# Patient Record
Sex: Female | Born: 1995 | Race: White | Hispanic: No | Marital: Single | Smoking: Never smoker
Health system: Southern US, Community
[De-identification: ages and names within clinical notes are randomized; demographics above are authoritative.]

## PROBLEM LIST (undated history)

## (undated) DIAGNOSIS — R197 Diarrhea, unspecified: Secondary | ICD-10-CM

## (undated) DIAGNOSIS — J302 Other seasonal allergic rhinitis: Secondary | ICD-10-CM

## (undated) HISTORY — PX: TOE SURGERY: SHX1073

## (undated) HISTORY — PX: TONSILLECTOMY: SUR1361

## (undated) HISTORY — DX: Diarrhea, unspecified: R19.7

## (undated) HISTORY — PX: WISDOM TOOTH EXTRACTION: SHX21

---
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
## 2018-10-15 ENCOUNTER — Other Ambulatory Visit: Payer: Self-pay

## 2018-10-15 ENCOUNTER — Emergency Department (HOSPITAL_COMMUNITY): Payer: Medicaid Other

## 2018-10-15 ENCOUNTER — Encounter (HOSPITAL_COMMUNITY): Payer: Self-pay

## 2018-10-15 DIAGNOSIS — W228XXA Striking against or struck by other objects, initial encounter: Secondary | ICD-10-CM | POA: Diagnosis not present

## 2018-10-15 DIAGNOSIS — R030 Elevated blood-pressure reading, without diagnosis of hypertension: Secondary | ICD-10-CM | POA: Diagnosis not present

## 2018-10-15 DIAGNOSIS — S90121A Contusion of right lesser toe(s) without damage to nail, initial encounter: Secondary | ICD-10-CM | POA: Diagnosis not present

## 2018-10-15 DIAGNOSIS — S99921A Unspecified injury of right foot, initial encounter: Secondary | ICD-10-CM | POA: Diagnosis present

## 2018-10-15 DIAGNOSIS — Y998 Other external cause status: Secondary | ICD-10-CM | POA: Diagnosis not present

## 2018-10-15 DIAGNOSIS — Y9389 Activity, other specified: Secondary | ICD-10-CM | POA: Diagnosis not present

## 2018-10-15 DIAGNOSIS — Y929 Unspecified place or not applicable: Secondary | ICD-10-CM | POA: Diagnosis not present

## 2018-10-15 NOTE — ED Triage Notes (Signed)
Pt reports that she broke her R pinky toe last night. States that last night, it was numb, but tonight it hurts a lot more.

## 2018-10-16 ENCOUNTER — Emergency Department (HOSPITAL_COMMUNITY)
Admission: EM | Admit: 2018-10-16 | Discharge: 2018-10-16 | Disposition: A | Discharge status: Home / Self Care | Payer: Medicaid Other | Attending: Emergency Medicine | Admitting: Emergency Medicine

## 2018-10-16 DIAGNOSIS — R030 Elevated blood-pressure reading, without diagnosis of hypertension: Secondary | ICD-10-CM

## 2018-10-16 DIAGNOSIS — S90121A Contusion of right lesser toe(s) without damage to nail, initial encounter: Secondary | ICD-10-CM

## 2018-10-16 NOTE — Discharge Instructions (Signed)
Apply ice for 30 minutes at a time, 4 times a day.  Take naproxen or ibuprofen for pain.  For additional pain relief, add acetaminophen.  Your blood pressure was very high today.  That might be because of the stress of coming to the hospital, but you should have your blood pressure checked several times in the next 2 weeks to make sure that it is coming down.  If it is staying high, you may need to be started on medication to control your blood pressure.

## 2018-10-16 NOTE — ED Provider Notes (Signed)
[facility] DEPT Provider Note   CSN: [id number] Arrival date & time: 10/15/18  2212    History   Chief Complaint Chief Complaint  Patient presents with  . Toe Pain    HPI Misty Griffith is a 23 y.o. female.   The history is provided by the patient.  Toe Pain  She states that she injured her right fifth toe yesterday when she accidentally hit it on a sack of potatoes, and she thinks she broke her toe.  She denies other injury.  Pain is rated at 9/10.  She has treated it with keeping it elevated, but has not taken anything for pain and has not applied ice.  Past Medical History:  Diagnosis Date  . Diarrhea    Possible IBS?  . Seasonal allergies     Patient Active Problem List   Diagnosis Date Noted  . Excessive gas 06/28/2011  . Simple constipation 06/20/2011  . Lower abdominal pain     Past Surgical History:  Procedure Laterality Date  . TOE SURGERY    . TONSILLECTOMY    . WISDOM TOOTH EXTRACTION       OB History    Gravida  0   Para      Term      Preterm      AB      Living        SAB      TAB      Ectopic      Multiple      Live Births               Home Medications    Prior to Admission medications   Medication Sig Start Date End Date Taking? Authorizing Provider  cetirizine (ZYRTEC) 10 MG tablet Take 10 mg by mouth daily as needed for allergies.     [provider]  HYDROcodone-acetaminophen (NORCO/VICODIN) 5-325 MG per tablet Take 0.5 tablets by mouth at bedtime as needed (cough). 09/17/13   Gregor Hams, MD  naproxen sodium (ANAPROX) 220 MG tablet Take 220 mg by mouth 2 (two) times daily as needed.    [provider]  predniSONE (DELTASONE) 10 MG tablet Take 3 tablets (30 mg total) by mouth daily. 09/17/13   Gregor Hams, MD    Family History History reviewed. No pertinent family history.  Social History Social History   Tobacco Use  . Smoking status: Never Smoker  .  Smokeless tobacco: Never Used  Substance Use Topics  . Alcohol use: No  . Drug use: No     Allergies   Food   Review of Systems Review of Systems  All other systems reviewed and are negative.    Physical Exam Updated Vital Signs BP (!) 153/111   Pulse (!) 130   Temp 98.7 F (37.1 C) (Oral)   Resp 16   Ht 5\' 4"  (1.626 m)   LMP 10/06/2018   SpO2 98%   Physical Exam Vitals signs and nursing note reviewed.    23 year old female, resting comfortably and in no acute distress. Vital signs are significant for elevated blood pressure and heart rate. Oxygen saturation is 98%, which is normal. Head is normocephalic and atraumatic. PERRLA, EOMI. Oropharynx is clear. Neck is nontender and supple without adenopathy or JVD. Back is nontender and there is no CVA tenderness. Lungs are clear without rales, wheezes, or rhonchi. Chest is nontender. Heart has regular rate and rhythm without murmur. Abdomen is soft,  flat, nontender without masses or hepatosplenomegaly and peristalsis is normoactive. Extremities: Minimal swelling and ecchymosis distal phalanx of right fifth toe.  There is tenderness palpation over the middle and distal phalanges of the right fifth toe.  No other injury seen. Skin is warm and dry without rash. Neurologic: Mental status is normal, cranial nerves are intact, there are no motor or sensory deficits.  ED Treatments / Results   Radiology Dg Foot Complete Right  Result Date: 10/15/2018 CLINICAL DATA:  23 year old female with injury to the right pinky toe. EXAM: RIGHT FOOT COMPLETE - 3+ VIEW COMPARISON:  Right foot radiograph dated 06/27/2012 FINDINGS: There is no evidence of fracture or dislocation. There is no evidence of arthropathy or other focal bone abnormality. Soft tissues are unremarkable. IMPRESSION: Negative. Electronically Signed   By: Elgie CollardArash  Radparvar M.D.   On: 10/15/2018 23:30    Procedures Procedures  Medications Ordered in ED Medications - No  data to display   Initial Impression / Assessment and Plan / ED Course  I have reviewed the triage vital signs and the nursing notes.  Pertinent imaging results that were available during my care of the patient were reviewed by me and considered in my medical decision making (see chart for details).  Injury to right fifth toe.  X-rays obtained showing no evidence of fracture.  Patient is reassured about x-ray findings, encouraged to apply ice and keep foot elevated.  Also advised of elevated blood pressure and need for follow-up as an outpatient.  Old records are reviewed, and she has no relevant past visits  Final Clinical Impressions(s) / ED Diagnoses   Final diagnoses:  Contusion of fifth toe, right, initial encounter  Elevated blood pressure reading without diagnosis of hypertension    ED Discharge Orders    None       Dione BoozeGlick, Tomika Eckles, MD 10/16/18 0127

## 2021-05-20 IMAGING — CR RIGHT FOOT COMPLETE - 3+ VIEW
3 series · 3 of 3 positions shown · non-contrast
Comparison: Right foot radiograph dated 06/27/2012

CLINICAL DATA: 22-year-old female with injury to the right pinky
toe.

EXAM:
RIGHT FOOT COMPLETE - 3+ VIEW

[x foot ap right]
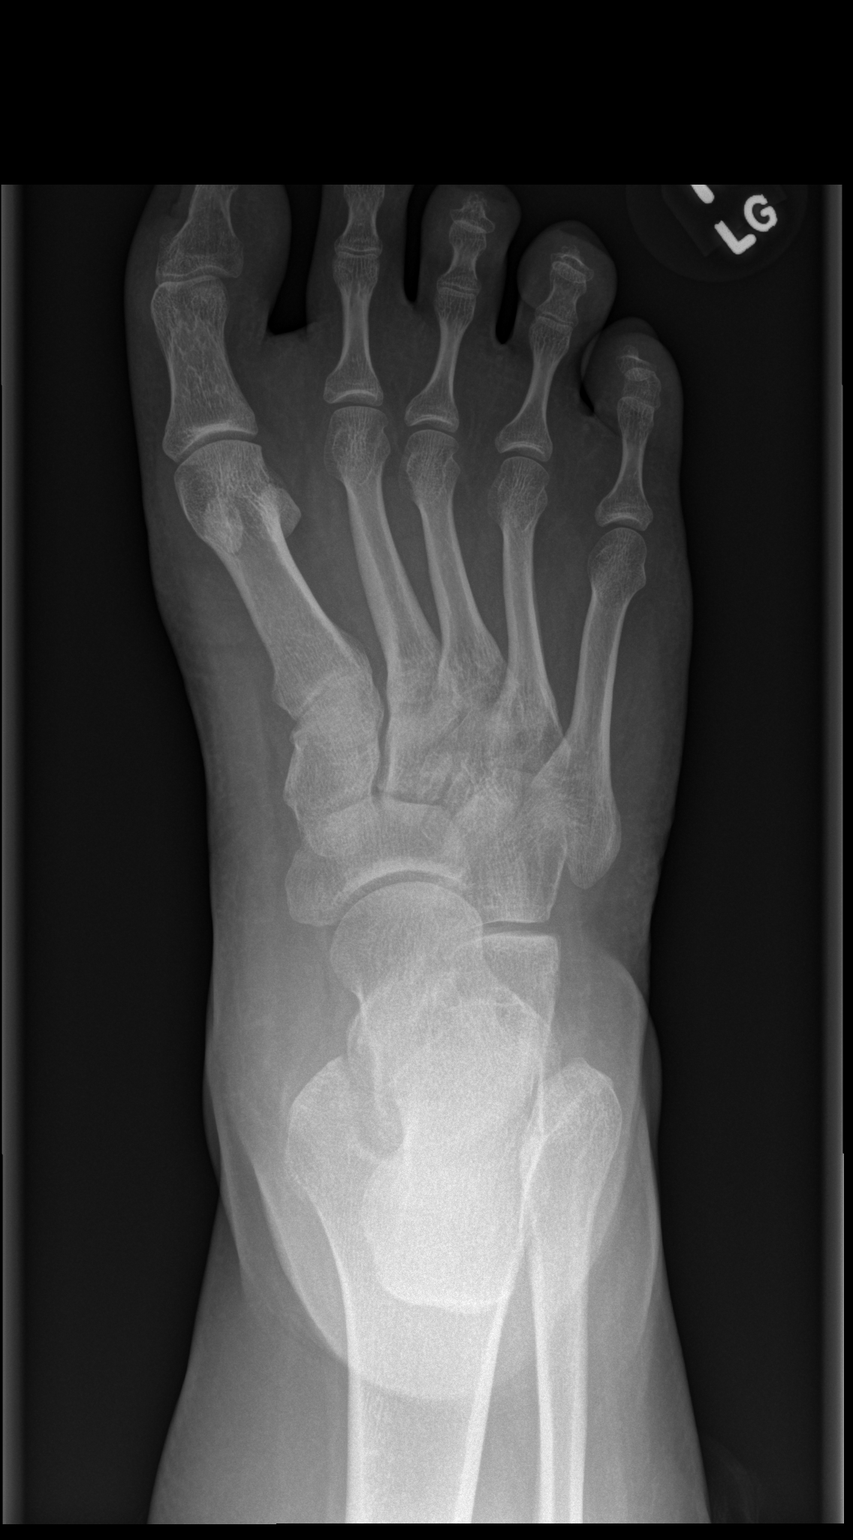

[x foot obl right]
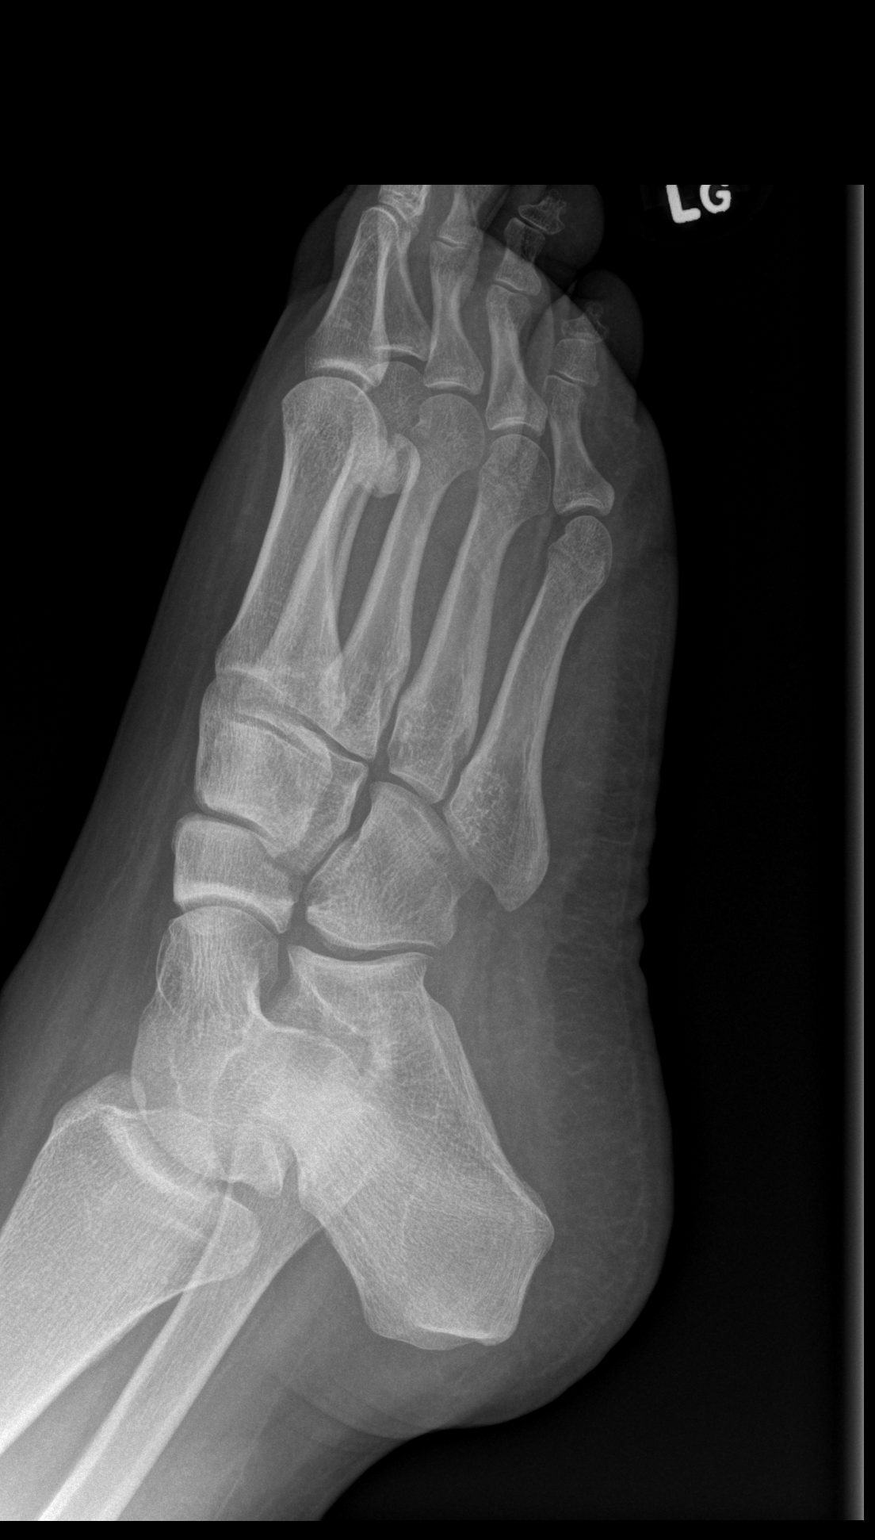

[x foot lat right]
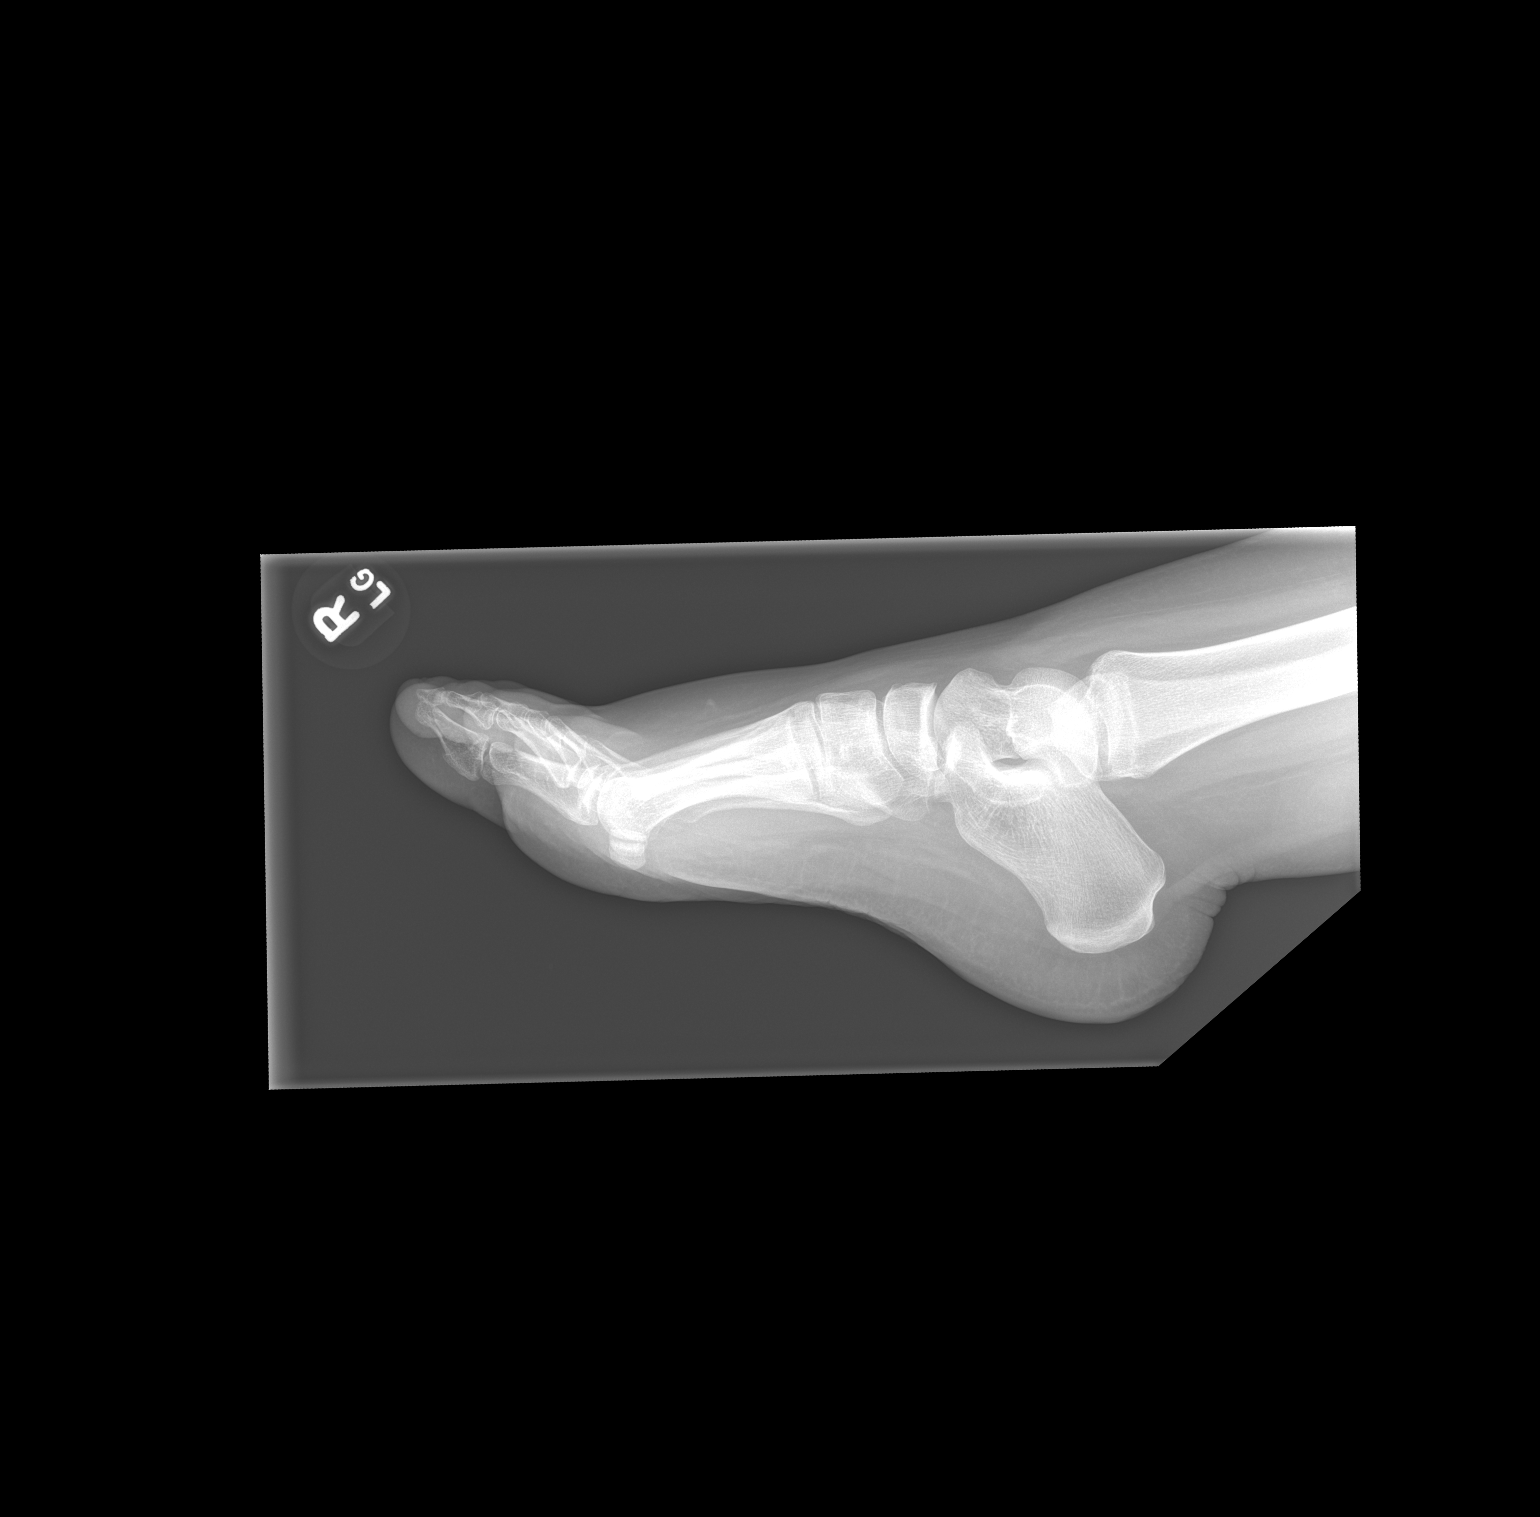

[3 of 3 positions shown; findings below may reference images not displayed]

FINDINGS: There is no evidence of fracture or dislocation. There is no
evidence of arthropathy or other focal bone abnormality. Soft
tissues are unremarkable.
IMPRESSION: Negative.

## 2021-10-16 ENCOUNTER — Encounter: Payer: Self-pay | Admitting: General Practice

## 2021-10-17 ENCOUNTER — Telehealth: Payer: Self-pay | Admitting: General Practice

## 2021-10-17 NOTE — Telephone Encounter (Signed)
Patient referred to our office.  Left message on VM for pt to contact our office to schedule new patient appt.

## 2022-07-26 ENCOUNTER — Other Ambulatory Visit (HOSPITAL_BASED_OUTPATIENT_CLINIC_OR_DEPARTMENT_OTHER): Payer: Self-pay

## 2022-07-26 MED ORDER — MOUNJARO 5 MG/0.5ML ~~LOC~~ SOAJ
5.0000 mg | SUBCUTANEOUS | 0 refills | Status: DC
  Filled 2022-07-26 (×2): qty 2, 28d supply, fill #0

## 2022-07-27 ENCOUNTER — Other Ambulatory Visit (HOSPITAL_BASED_OUTPATIENT_CLINIC_OR_DEPARTMENT_OTHER): Payer: Self-pay

## 2022-07-31 ENCOUNTER — Other Ambulatory Visit (HOSPITAL_BASED_OUTPATIENT_CLINIC_OR_DEPARTMENT_OTHER): Payer: Self-pay

## 2022-08-01 ENCOUNTER — Other Ambulatory Visit (HOSPITAL_BASED_OUTPATIENT_CLINIC_OR_DEPARTMENT_OTHER): Payer: Self-pay

## 2022-08-02 ENCOUNTER — Other Ambulatory Visit (HOSPITAL_BASED_OUTPATIENT_CLINIC_OR_DEPARTMENT_OTHER): Payer: Self-pay

## 2022-08-22 ENCOUNTER — Other Ambulatory Visit (HOSPITAL_BASED_OUTPATIENT_CLINIC_OR_DEPARTMENT_OTHER): Payer: Self-pay

## 2022-08-22 MED ORDER — MOUNJARO 5 MG/0.5ML ~~LOC~~ SOAJ
5.0000 mg | SUBCUTANEOUS | 5 refills | Status: AC
  Filled 2022-08-22 – 2022-08-27 (×3): qty 2, 28d supply, fill #0

## 2022-08-27 ENCOUNTER — Other Ambulatory Visit (HOSPITAL_BASED_OUTPATIENT_CLINIC_OR_DEPARTMENT_OTHER): Payer: Self-pay

## 2022-08-28 ENCOUNTER — Other Ambulatory Visit (HOSPITAL_BASED_OUTPATIENT_CLINIC_OR_DEPARTMENT_OTHER): Payer: Self-pay

## 2022-08-29 ENCOUNTER — Other Ambulatory Visit (HOSPITAL_BASED_OUTPATIENT_CLINIC_OR_DEPARTMENT_OTHER): Payer: Self-pay

## 2022-08-30 ENCOUNTER — Other Ambulatory Visit (HOSPITAL_BASED_OUTPATIENT_CLINIC_OR_DEPARTMENT_OTHER): Payer: Self-pay

## 2022-09-03 ENCOUNTER — Other Ambulatory Visit (HOSPITAL_BASED_OUTPATIENT_CLINIC_OR_DEPARTMENT_OTHER): Payer: Self-pay

## 2022-09-04 ENCOUNTER — Other Ambulatory Visit (HOSPITAL_BASED_OUTPATIENT_CLINIC_OR_DEPARTMENT_OTHER): Payer: Self-pay

## 2022-09-05 ENCOUNTER — Other Ambulatory Visit (HOSPITAL_BASED_OUTPATIENT_CLINIC_OR_DEPARTMENT_OTHER): Payer: Self-pay

## 2022-09-06 ENCOUNTER — Other Ambulatory Visit (HOSPITAL_BASED_OUTPATIENT_CLINIC_OR_DEPARTMENT_OTHER): Payer: Self-pay

## 2022-09-07 ENCOUNTER — Other Ambulatory Visit (HOSPITAL_BASED_OUTPATIENT_CLINIC_OR_DEPARTMENT_OTHER): Payer: Self-pay

## 2022-09-10 ENCOUNTER — Other Ambulatory Visit (HOSPITAL_BASED_OUTPATIENT_CLINIC_OR_DEPARTMENT_OTHER): Payer: Self-pay

## 2022-09-11 ENCOUNTER — Other Ambulatory Visit (HOSPITAL_BASED_OUTPATIENT_CLINIC_OR_DEPARTMENT_OTHER): Payer: Self-pay

## 2022-09-12 ENCOUNTER — Other Ambulatory Visit (HOSPITAL_BASED_OUTPATIENT_CLINIC_OR_DEPARTMENT_OTHER): Payer: Self-pay

## 2022-09-24 ENCOUNTER — Other Ambulatory Visit (HOSPITAL_BASED_OUTPATIENT_CLINIC_OR_DEPARTMENT_OTHER): Payer: Self-pay

## 2022-10-04 ENCOUNTER — Other Ambulatory Visit (HOSPITAL_BASED_OUTPATIENT_CLINIC_OR_DEPARTMENT_OTHER): Payer: Self-pay

## 2022-10-05 ENCOUNTER — Other Ambulatory Visit (HOSPITAL_BASED_OUTPATIENT_CLINIC_OR_DEPARTMENT_OTHER): Payer: Self-pay

## 2022-10-08 ENCOUNTER — Other Ambulatory Visit (HOSPITAL_BASED_OUTPATIENT_CLINIC_OR_DEPARTMENT_OTHER): Payer: Self-pay

## 2022-10-09 ENCOUNTER — Other Ambulatory Visit (HOSPITAL_BASED_OUTPATIENT_CLINIC_OR_DEPARTMENT_OTHER): Payer: Self-pay

## 2022-10-11 ENCOUNTER — Other Ambulatory Visit (HOSPITAL_BASED_OUTPATIENT_CLINIC_OR_DEPARTMENT_OTHER): Payer: Self-pay

## 2022-10-15 ENCOUNTER — Other Ambulatory Visit (HOSPITAL_BASED_OUTPATIENT_CLINIC_OR_DEPARTMENT_OTHER): Payer: Self-pay

## 2022-10-16 ENCOUNTER — Other Ambulatory Visit (HOSPITAL_BASED_OUTPATIENT_CLINIC_OR_DEPARTMENT_OTHER): Payer: Self-pay

## 2022-10-19 ENCOUNTER — Other Ambulatory Visit (HOSPITAL_BASED_OUTPATIENT_CLINIC_OR_DEPARTMENT_OTHER): Payer: Self-pay

## 2022-10-22 ENCOUNTER — Other Ambulatory Visit (HOSPITAL_BASED_OUTPATIENT_CLINIC_OR_DEPARTMENT_OTHER): Payer: Self-pay

## 2022-10-23 ENCOUNTER — Other Ambulatory Visit (HOSPITAL_BASED_OUTPATIENT_CLINIC_OR_DEPARTMENT_OTHER): Payer: Self-pay

## 2022-11-05 ENCOUNTER — Other Ambulatory Visit (HOSPITAL_BASED_OUTPATIENT_CLINIC_OR_DEPARTMENT_OTHER): Payer: Self-pay

## 2022-11-06 ENCOUNTER — Other Ambulatory Visit (HOSPITAL_BASED_OUTPATIENT_CLINIC_OR_DEPARTMENT_OTHER): Payer: Self-pay

## 2022-11-08 ENCOUNTER — Other Ambulatory Visit (HOSPITAL_BASED_OUTPATIENT_CLINIC_OR_DEPARTMENT_OTHER): Payer: Self-pay

## 2022-11-12 ENCOUNTER — Other Ambulatory Visit (HOSPITAL_BASED_OUTPATIENT_CLINIC_OR_DEPARTMENT_OTHER): Payer: Self-pay

## 2022-11-12 MED ORDER — ONDANSETRON 4 MG PO TBDP
4.0000 mg | ORAL_TABLET | Freq: Three times a day (TID) | ORAL | 0 refills | Status: AC | PRN
  Filled 2022-11-12: qty 45, 15d supply, fill #0

## 2022-11-12 MED ORDER — METFORMIN HCL ER 500 MG PO TB24
ORAL_TABLET | ORAL | 2 refills | Status: AC
  Filled 2022-11-12: qty 60, 33d supply, fill #0

## 2022-11-12 MED ORDER — FLUTICASONE PROPIONATE 50 MCG/ACT NA SUSP
2.0000 | Freq: Every day | NASAL | 1 refills | Status: AC
  Filled 2022-11-12: qty 16, 30d supply, fill #0

## 2022-11-14 ENCOUNTER — Other Ambulatory Visit (HOSPITAL_BASED_OUTPATIENT_CLINIC_OR_DEPARTMENT_OTHER): Payer: Self-pay

## 2022-11-15 ENCOUNTER — Other Ambulatory Visit (HOSPITAL_BASED_OUTPATIENT_CLINIC_OR_DEPARTMENT_OTHER): Payer: Self-pay

## 2022-12-11 ENCOUNTER — Other Ambulatory Visit (HOSPITAL_BASED_OUTPATIENT_CLINIC_OR_DEPARTMENT_OTHER): Payer: Self-pay

## 2022-12-11 MED ORDER — MOUNJARO 2.5 MG/0.5ML ~~LOC~~ SOAJ
2.5000 mg | SUBCUTANEOUS | 1 refills | Status: AC
  Filled 2022-12-11: qty 2, 28d supply, fill #0

## 2022-12-13 ENCOUNTER — Other Ambulatory Visit (HOSPITAL_BASED_OUTPATIENT_CLINIC_OR_DEPARTMENT_OTHER): Payer: Self-pay

## 2022-12-14 ENCOUNTER — Other Ambulatory Visit (HOSPITAL_BASED_OUTPATIENT_CLINIC_OR_DEPARTMENT_OTHER): Payer: Self-pay

## 2022-12-24 ENCOUNTER — Other Ambulatory Visit (HOSPITAL_BASED_OUTPATIENT_CLINIC_OR_DEPARTMENT_OTHER): Payer: Self-pay

## 2022-12-25 ENCOUNTER — Other Ambulatory Visit (HOSPITAL_BASED_OUTPATIENT_CLINIC_OR_DEPARTMENT_OTHER): Payer: Self-pay

## 2022-12-28 ENCOUNTER — Encounter (HOSPITAL_BASED_OUTPATIENT_CLINIC_OR_DEPARTMENT_OTHER): Payer: Self-pay

## 2022-12-28 ENCOUNTER — Other Ambulatory Visit (HOSPITAL_BASED_OUTPATIENT_CLINIC_OR_DEPARTMENT_OTHER): Payer: Self-pay

## 2022-12-31 ENCOUNTER — Other Ambulatory Visit (HOSPITAL_BASED_OUTPATIENT_CLINIC_OR_DEPARTMENT_OTHER): Payer: Self-pay

## 2023-01-01 ENCOUNTER — Other Ambulatory Visit (HOSPITAL_BASED_OUTPATIENT_CLINIC_OR_DEPARTMENT_OTHER): Payer: Self-pay

## 2023-01-03 ENCOUNTER — Other Ambulatory Visit (HOSPITAL_BASED_OUTPATIENT_CLINIC_OR_DEPARTMENT_OTHER): Payer: Self-pay

## 2023-01-04 ENCOUNTER — Other Ambulatory Visit (HOSPITAL_BASED_OUTPATIENT_CLINIC_OR_DEPARTMENT_OTHER): Payer: Self-pay

## 2023-01-09 ENCOUNTER — Other Ambulatory Visit (HOSPITAL_BASED_OUTPATIENT_CLINIC_OR_DEPARTMENT_OTHER): Payer: Self-pay

## 2023-01-11 ENCOUNTER — Other Ambulatory Visit (HOSPITAL_BASED_OUTPATIENT_CLINIC_OR_DEPARTMENT_OTHER): Payer: Self-pay

## 2023-01-14 ENCOUNTER — Other Ambulatory Visit (HOSPITAL_BASED_OUTPATIENT_CLINIC_OR_DEPARTMENT_OTHER): Payer: Self-pay

## 2023-01-16 ENCOUNTER — Other Ambulatory Visit (HOSPITAL_BASED_OUTPATIENT_CLINIC_OR_DEPARTMENT_OTHER): Payer: Self-pay

## 2023-01-17 ENCOUNTER — Other Ambulatory Visit (HOSPITAL_BASED_OUTPATIENT_CLINIC_OR_DEPARTMENT_OTHER): Payer: Self-pay

## 2023-01-18 ENCOUNTER — Other Ambulatory Visit (HOSPITAL_BASED_OUTPATIENT_CLINIC_OR_DEPARTMENT_OTHER): Payer: Self-pay

## 2023-01-21 ENCOUNTER — Other Ambulatory Visit (HOSPITAL_BASED_OUTPATIENT_CLINIC_OR_DEPARTMENT_OTHER): Payer: Self-pay

## 2023-01-22 ENCOUNTER — Other Ambulatory Visit (HOSPITAL_BASED_OUTPATIENT_CLINIC_OR_DEPARTMENT_OTHER): Payer: Self-pay

## 2023-01-24 ENCOUNTER — Other Ambulatory Visit (HOSPITAL_BASED_OUTPATIENT_CLINIC_OR_DEPARTMENT_OTHER): Payer: Self-pay

## 2023-01-29 ENCOUNTER — Other Ambulatory Visit (HOSPITAL_BASED_OUTPATIENT_CLINIC_OR_DEPARTMENT_OTHER): Payer: Self-pay

## 2023-02-04 ENCOUNTER — Other Ambulatory Visit (HOSPITAL_BASED_OUTPATIENT_CLINIC_OR_DEPARTMENT_OTHER): Payer: Self-pay

## 2023-02-06 ENCOUNTER — Other Ambulatory Visit (HOSPITAL_BASED_OUTPATIENT_CLINIC_OR_DEPARTMENT_OTHER): Payer: Self-pay

## 2023-02-07 ENCOUNTER — Other Ambulatory Visit (HOSPITAL_BASED_OUTPATIENT_CLINIC_OR_DEPARTMENT_OTHER): Payer: Self-pay
